# Patient Record
Sex: Female | Born: 1965 | Race: White | Hispanic: No | Smoking: Former smoker
Health system: Southern US, Community
[De-identification: ages and names within clinical notes are randomized; demographics above are authoritative.]

## PROBLEM LIST (undated history)

## (undated) DIAGNOSIS — G43909 Migraine, unspecified, not intractable, without status migrainosus: Secondary | ICD-10-CM

## (undated) DIAGNOSIS — E669 Obesity, unspecified: Secondary | ICD-10-CM

## (undated) DIAGNOSIS — R195 Other fecal abnormalities: Secondary | ICD-10-CM

## (undated) DIAGNOSIS — N951 Menopausal and female climacteric states: Secondary | ICD-10-CM

## (undated) DIAGNOSIS — B369 Superficial mycosis, unspecified: Secondary | ICD-10-CM

## (undated) DIAGNOSIS — E559 Vitamin D deficiency, unspecified: Secondary | ICD-10-CM

## (undated) HISTORY — DX: Other fecal abnormalities: R19.5

## (undated) HISTORY — DX: Migraine, unspecified, not intractable, without status migrainosus: G43.909

## (undated) HISTORY — DX: Superficial mycosis, unspecified: B36.9

## (undated) HISTORY — DX: Obesity, unspecified: E66.9

## (undated) HISTORY — DX: Menopausal and female climacteric states: N95.1

## (undated) HISTORY — DX: Vitamin D deficiency, unspecified: E55.9

---

## 1989-01-05 HISTORY — PX: DILATION AND CURETTAGE OF UTERUS: SHX78

## 1989-01-05 HISTORY — PX: FOOT SURGERY: SHX648

## 2004-01-28 ENCOUNTER — Ambulatory Visit (HOSPITAL_COMMUNITY): Admission: RE | Admit: 2004-01-28 | Discharge: 2004-01-28 | Payer: Self-pay | Admitting: Family Medicine

## 2004-03-06 ENCOUNTER — Ambulatory Visit (HOSPITAL_COMMUNITY): Admission: RE | Admit: 2004-03-06 | Discharge: 2004-03-06 | Payer: Self-pay | Admitting: Family Medicine

## 2006-10-19 ENCOUNTER — Other Ambulatory Visit: Admission: RE | Admit: 2006-10-19 | Discharge: 2006-10-19 | Payer: Self-pay | Admitting: Obstetrics and Gynecology

## 2007-01-17 ENCOUNTER — Ambulatory Visit (HOSPITAL_COMMUNITY): Admission: RE | Admit: 2007-01-17 | Discharge: 2007-01-17 | Payer: Self-pay | Admitting: Family Medicine

## 2007-01-17 ENCOUNTER — Encounter: Payer: Self-pay | Admitting: Orthopedic Surgery

## 2007-02-17 ENCOUNTER — Ambulatory Visit: Payer: Self-pay | Admitting: Orthopedic Surgery

## 2007-02-17 DIAGNOSIS — IMO0002 Reserved for concepts with insufficient information to code with codable children: Secondary | ICD-10-CM | POA: Insufficient documentation

## 2007-02-17 DIAGNOSIS — M1710 Unilateral primary osteoarthritis, unspecified knee: Secondary | ICD-10-CM

## 2007-02-17 DIAGNOSIS — M25569 Pain in unspecified knee: Secondary | ICD-10-CM | POA: Insufficient documentation

## 2007-11-03 ENCOUNTER — Other Ambulatory Visit: Admission: RE | Admit: 2007-11-03 | Discharge: 2007-11-03 | Payer: Self-pay | Admitting: Obstetrics and Gynecology

## 2008-01-03 ENCOUNTER — Ambulatory Visit (HOSPITAL_COMMUNITY): Admission: RE | Admit: 2008-01-03 | Discharge: 2008-01-03 | Payer: Self-pay | Admitting: Obstetrics and Gynecology

## 2008-01-06 HISTORY — PX: SHOULDER ARTHROSCOPY W/ ROTATOR CUFF REPAIR: SHX2400

## 2008-03-26 ENCOUNTER — Ambulatory Visit (HOSPITAL_COMMUNITY): Admission: RE | Admit: 2008-03-26 | Discharge: 2008-03-26 | Payer: Self-pay | Admitting: Family Medicine

## 2008-11-15 ENCOUNTER — Other Ambulatory Visit: Admission: RE | Admit: 2008-11-15 | Discharge: 2008-11-15 | Payer: Self-pay | Admitting: Obstetrics and Gynecology

## 2009-01-07 ENCOUNTER — Observation Stay (HOSPITAL_COMMUNITY): Admission: EM | Admit: 2009-01-07 | Discharge: 2009-01-08 | Payer: Self-pay | Admitting: Emergency Medicine

## 2009-01-07 ENCOUNTER — Ambulatory Visit: Payer: Self-pay | Admitting: Cardiology

## 2009-01-08 ENCOUNTER — Encounter (INDEPENDENT_AMBULATORY_CARE_PROVIDER_SITE_OTHER): Payer: Self-pay | Admitting: Internal Medicine

## 2009-01-11 ENCOUNTER — Ambulatory Visit (HOSPITAL_COMMUNITY): Admission: RE | Admit: 2009-01-11 | Discharge: 2009-01-11 | Payer: Self-pay | Admitting: Internal Medicine

## 2009-01-11 ENCOUNTER — Ambulatory Visit (HOSPITAL_COMMUNITY): Admission: RE | Admit: 2009-01-11 | Discharge: 2009-01-11 | Payer: Self-pay | Admitting: Obstetrics and Gynecology

## 2009-05-23 ENCOUNTER — Ambulatory Visit: Payer: Self-pay | Admitting: Orthopedic Surgery

## 2009-05-23 DIAGNOSIS — M7580 Other shoulder lesions, unspecified shoulder: Secondary | ICD-10-CM

## 2009-05-23 DIAGNOSIS — M25819 Other specified joint disorders, unspecified shoulder: Secondary | ICD-10-CM | POA: Insufficient documentation

## 2009-08-16 ENCOUNTER — Telehealth: Payer: Self-pay | Admitting: Orthopedic Surgery

## 2009-08-26 ENCOUNTER — Encounter: Payer: Self-pay | Admitting: Orthopedic Surgery

## 2009-08-26 ENCOUNTER — Telehealth: Payer: Self-pay | Admitting: Orthopedic Surgery

## 2009-12-03 ENCOUNTER — Other Ambulatory Visit
Admission: RE | Admit: 2009-12-03 | Discharge: 2009-12-03 | Payer: Self-pay | Source: Home / Self Care | Admitting: Obstetrics and Gynecology

## 2010-01-26 ENCOUNTER — Encounter: Payer: Self-pay | Admitting: Internal Medicine

## 2010-01-27 ENCOUNTER — Ambulatory Visit (HOSPITAL_COMMUNITY)
Admission: RE | Admit: 2010-01-27 | Discharge: 2010-01-27 | Payer: Self-pay | Source: Home / Self Care | Attending: Obstetrics and Gynecology | Admitting: Obstetrics and Gynecology

## 2010-02-04 NOTE — Progress Notes (Signed)
Summary: Auth signed + Office notes faxed per patient request  Phone Note Call from Patient   Caller: Patient Summary of Call: Patient signed release of info form to authorize fax of office notes to [facility] for review for appointment.  Faxed to their office Fax [phone].    *Re-faxed to  Fax [phone]; notified patient. Initial call taken by: Cammie Sickle,  August 16, 2009 2:23 PM

## 2010-02-04 NOTE — Assessment & Plan Note (Signed)
Summary: NEW PROBLEM LT SHOULDER PAIN/NEEDS XRAY/[facility]/REF [facility]/CAF   Vital Signs:  Patient profile:   45 year old female Height:      66 inches Weight:      225 pounds Pulse rate:   76 / minute Resp:     16 per minute  Vitals Entered By: Fuller Canada MD (May 23, 2009 8:45 AM)  Visit Type:  new problem Referring Provider:  Dr. Nobie Putnam Primary Provider:  Dr. Regino Schultze  CC:  left shoulder pain.  History of Present Illness: 45 year old female presents with pain over her LEFT a.c. joint for 2 months and pain over the medial border of the scapula with occasional pain radiating to her arm.  She says she was injured when a branch which was large fell onto her shoulder.  The pain occurs throughout the day worse at night occasionally relieved by ice and heat worse with overuse, pain is sharp, pain is 8/10, pain associated with tingling locking and swelling.  Treatment included Vicodin Naprosyn Voltaren gel.  Voltaren gel seem to work the best Xrays today in our office.  Meds: Ortho Novum, Vicodin, Axert, Xanax.    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 Respiratory:  Complains of short of breath; denies  wheezing, couch, tightness, pain on inspiration, and snoring . Musculoskeletal:  Complains of joint pain, swelling, and muscle pain; denies instability, stiffness, redness, and heat. HEENT:  Denies blurred or double vision, eye pain, redness, and watering; headaches.  The review of systems is negative for Constitutional, Gastrointestinal, Genitourinary, Neurologic, Endocrine, Psychiatric, Skin, Immunology, and Hemoatologic.  Physical Exam  Additional Exam:  General: The patient is well-developed and well-nourished grooming and hygiene is normal.  Cardiovascular exam was normal  Her skin was intact no bruising rashes or swelling  She had normal sensation and reflexes in both upper extremities  Should no lymphadenopathy in the cervical or axillary areas  The LEFT shoulder was tender over the a.c. joint.  Range of motion was full.  Strength was normal.  Stability was normal.  She did have a positive impingement sign with forward elevation.  She has a negative Hawkins sign and no pain with adduction.   Impression & Recommendations:  Problem # 1:  IMPINGEMENT SYNDROME (ICD-726.2) Assessment New  x-rays of the LEFT shoulder AP and scapular Y. with 10 caudal tilt  Type I Acromion normal GH joint   Normal shoulder  We went ahead and proceeded with a subacromial injection of the LEFT shoulder Verbal consent obtained/The shoulder was injected with depomedrol 40mg /cc and sensorcaine .25% . There were no complications  Orders: Est. Patient Level IV (16109) Depo- Medrol 40mg  (J1030) Joint Aspirate / Injection, Large (20610) Shoulder x-ray,  minimum 2 views (60454)  Patient Instructions: 1)  You have received an injection of cortisone today. You  may experience increased pain at the injection site. Apply ice pack to the area for 20 minutes every 2 hours and take 2 xtra strength tylenol every 8 hours. This increased pain will usually resolve in 24 hours. The injection will take effect in  3-10 days.  2)  Home exercise program x 6 weeks  3)  Please schedule a follow-up appointment as needed.

## 2010-02-04 NOTE — Letter (Signed)
Summary: office notes faxed [facility]   office notes faxed [facility]   Imported By: Cammie Sickle 08/27/2009 11:56:47  _____________________________________________________________________  External Attachment:    Type:   Image     Comment:   External Document

## 2010-02-04 NOTE — Progress Notes (Signed)
Summary: patient states [facility] did not receive our fax  Phone Note Call from Patient   Caller: Patient Summary of Call: Patient ret'd from trip and called [facility] and was told that the records were never rec'd as per pt request. As per last note, they were faxed to 2 different ph #'s.  Re-faxed to [phone] atten: Selena Batten. Initial call taken by: Cammie Sickle,  August 26, 2009 1:44 PM  Follow-up for Phone Call        Fax rec'd and appt is scheduled at [facility] per Selena Batten per patient request. Follow-up by: Cammie Sickle,  August 28, 2009 12:15 PM

## 2010-02-04 NOTE — Letter (Signed)
Summary: History form  History form   Imported By: Jacklynn Ganong 05/31/2009 08:48:24  _____________________________________________________________________  External Attachment:    Type:   Image     Comment:   External Document

## 2010-02-06 ENCOUNTER — Other Ambulatory Visit: Payer: Self-pay | Admitting: Obstetrics and Gynecology

## 2010-02-06 DIAGNOSIS — R928 Other abnormal and inconclusive findings on diagnostic imaging of breast: Secondary | ICD-10-CM

## 2010-02-12 ENCOUNTER — Ambulatory Visit
Admission: RE | Admit: 2010-02-12 | Discharge: 2010-02-12 | Disposition: A | Discharge status: Home / Self Care | Payer: 59 | Source: Ambulatory Visit | Attending: Obstetrics and Gynecology | Admitting: Obstetrics and Gynecology

## 2010-02-12 DIAGNOSIS — R928 Other abnormal and inconclusive findings on diagnostic imaging of breast: Secondary | ICD-10-CM

## 2010-03-23 LAB — HEPATIC FUNCTION PANEL
ALT: 17 U/L (ref 0–35)
Albumin: 3.7 g/dL (ref 3.5–5.2)
Alkaline Phosphatase: 46 U/L (ref 39–117)
Indirect Bilirubin: 0.6 mg/dL (ref 0.3–0.9)
Total Bilirubin: 0.7 mg/dL (ref 0.3–1.2)

## 2010-03-23 LAB — CARDIAC PANEL(CRET KIN+CKTOT+MB+TROPI)
CK, MB: 1.2 ng/mL (ref 0.3–4.0)
Troponin I: <0.01 ng/mL (ref 0.00–0.06)

## 2010-03-23 LAB — CBC
MCHC: 34.4 g/dL (ref 30.0–36.0)
MCV: 90.1 fL (ref 78.0–100.0)
RDW: 13.2 % (ref 11.5–15.5)

## 2010-03-23 LAB — BASIC METABOLIC PANEL
BUN: 7 mg/dL (ref 6–23)
BUN: 9 mg/dL (ref 6–23)
CO2: 26 mEq/L (ref 19–32)
Calcium: 9 mg/dL (ref 8.4–10.5)
Chloride: 103 mEq/L (ref 96–112)
Creatinine, Ser: 0.65 mg/dL (ref 0.4–1.2)
Creatinine, Ser: 0.68 mg/dL (ref 0.4–1.2)
GFR calc Af Amer: >60 mL/min (ref >60)
Glucose, Bld: 102 mg/dL — ABNORMAL HIGH (ref 70–99)
Glucose, Bld: 115 mg/dL — ABNORMAL HIGH (ref 70–99)
Potassium: 3.7 mEq/L (ref 3.5–5.1)

## 2010-03-23 LAB — DIFFERENTIAL
Basophils Absolute: 0.1 10*3/uL (ref 0.0–0.1)
Basophils Relative: 1 % (ref 0–1)
Eosinophils Absolute: 0 10*3/uL (ref 0.0–0.7)
Neutro Abs: 6.3 10*3/uL (ref 1.7–7.7)
Neutrophils Relative %: 61 % (ref 43–77)

## 2010-03-23 LAB — POCT CARDIAC MARKERS
CKMB, poc: 1.1 ng/mL (ref 1.0–8.0)
Myoglobin, poc: 68 ng/mL (ref 12–200)

## 2010-03-23 LAB — LIPID PANEL
HDL: 50 mg/dL (ref >39)
LDL Cholesterol: 101 mg/dL — ABNORMAL HIGH (ref 0–99)
Total CHOL/HDL Ratio: 3.5 RATIO
Triglycerides: 112 mg/dL (ref <150)
VLDL: 22 mg/dL (ref 0–40)

## 2010-03-23 LAB — LIPASE, BLOOD: Lipase: 20 U/L (ref 11–59)

## 2010-04-09 ENCOUNTER — Other Ambulatory Visit: Payer: Self-pay | Admitting: Obstetrics and Gynecology

## 2010-04-09 DIAGNOSIS — N6310 Unspecified lump in the right breast, unspecified quadrant: Secondary | ICD-10-CM

## 2010-04-10 ENCOUNTER — Ambulatory Visit
Admission: RE | Admit: 2010-04-10 | Discharge: 2010-04-10 | Disposition: A | Discharge status: Home / Self Care | Payer: 59 | Source: Ambulatory Visit | Attending: Obstetrics and Gynecology | Admitting: Obstetrics and Gynecology

## 2010-04-10 ENCOUNTER — Other Ambulatory Visit: Payer: Self-pay | Admitting: Obstetrics and Gynecology

## 2010-04-10 DIAGNOSIS — N6310 Unspecified lump in the right breast, unspecified quadrant: Secondary | ICD-10-CM

## 2010-07-10 ENCOUNTER — Other Ambulatory Visit: Payer: Self-pay | Admitting: Obstetrics and Gynecology

## 2010-07-10 DIAGNOSIS — R922 Inconclusive mammogram: Secondary | ICD-10-CM

## 2010-08-11 ENCOUNTER — Ambulatory Visit
Admission: RE | Admit: 2010-08-11 | Discharge: 2010-08-11 | Disposition: A | Discharge status: Home / Self Care | Payer: 59 | Source: Ambulatory Visit | Attending: Obstetrics and Gynecology | Admitting: Obstetrics and Gynecology

## 2010-08-11 DIAGNOSIS — R922 Inconclusive mammogram: Secondary | ICD-10-CM

## 2010-12-10 ENCOUNTER — Other Ambulatory Visit (HOSPITAL_COMMUNITY)
Admission: RE | Admit: 2010-12-10 | Discharge: 2010-12-10 | Disposition: A | Discharge status: Home / Self Care | Payer: 59 | Source: Ambulatory Visit | Attending: Obstetrics and Gynecology | Admitting: Obstetrics and Gynecology

## 2010-12-10 ENCOUNTER — Other Ambulatory Visit: Payer: Self-pay | Admitting: Adult Health

## 2010-12-10 ENCOUNTER — Other Ambulatory Visit: Payer: Self-pay | Admitting: Obstetrics and Gynecology

## 2010-12-10 DIAGNOSIS — Z01419 Encounter for gynecological examination (general) (routine) without abnormal findings: Secondary | ICD-10-CM | POA: Insufficient documentation

## 2010-12-10 DIAGNOSIS — Z1231 Encounter for screening mammogram for malignant neoplasm of breast: Secondary | ICD-10-CM

## 2011-01-30 ENCOUNTER — Ambulatory Visit: Payer: 59

## 2011-02-10 ENCOUNTER — Ambulatory Visit
Admission: RE | Admit: 2011-02-10 | Discharge: 2011-02-10 | Disposition: A | Discharge status: Home / Self Care | Payer: 59 | Source: Ambulatory Visit | Attending: Obstetrics and Gynecology | Admitting: Obstetrics and Gynecology

## 2011-02-10 DIAGNOSIS — Z1231 Encounter for screening mammogram for malignant neoplasm of breast: Secondary | ICD-10-CM

## 2011-09-14 ENCOUNTER — Ambulatory Visit (HOSPITAL_COMMUNITY)
Admission: RE | Admit: 2011-09-14 | Discharge: 2011-09-14 | Disposition: A | Discharge status: Home / Self Care | Payer: 59 | Source: Ambulatory Visit | Attending: Internal Medicine | Admitting: Internal Medicine

## 2011-09-14 ENCOUNTER — Other Ambulatory Visit (HOSPITAL_COMMUNITY): Payer: Self-pay | Admitting: Internal Medicine

## 2011-09-14 DIAGNOSIS — M25549 Pain in joints of unspecified hand: Secondary | ICD-10-CM

## 2012-01-21 ENCOUNTER — Other Ambulatory Visit: Payer: Self-pay | Admitting: Obstetrics and Gynecology

## 2012-01-21 DIAGNOSIS — Z1231 Encounter for screening mammogram for malignant neoplasm of breast: Secondary | ICD-10-CM

## 2012-01-28 ENCOUNTER — Other Ambulatory Visit: Payer: Self-pay | Admitting: Adult Health

## 2012-01-28 ENCOUNTER — Other Ambulatory Visit (HOSPITAL_COMMUNITY)
Admission: RE | Admit: 2012-01-28 | Discharge: 2012-01-28 | Disposition: A | Discharge status: Home / Self Care | Payer: 59 | Source: Ambulatory Visit | Attending: Obstetrics and Gynecology | Admitting: Obstetrics and Gynecology

## 2012-01-28 DIAGNOSIS — Z01419 Encounter for gynecological examination (general) (routine) without abnormal findings: Secondary | ICD-10-CM | POA: Insufficient documentation

## 2012-01-28 DIAGNOSIS — Z1151 Encounter for screening for human papillomavirus (HPV): Secondary | ICD-10-CM | POA: Insufficient documentation

## 2012-02-12 ENCOUNTER — Ambulatory Visit
Admission: RE | Admit: 2012-02-12 | Discharge: 2012-02-12 | Disposition: A | Discharge status: Home / Self Care | Payer: 59 | Source: Ambulatory Visit | Attending: Obstetrics and Gynecology | Admitting: Obstetrics and Gynecology

## 2012-02-12 ENCOUNTER — Ambulatory Visit: Payer: 59

## 2012-02-12 DIAGNOSIS — Z1231 Encounter for screening mammogram for malignant neoplasm of breast: Secondary | ICD-10-CM

## 2012-12-20 ENCOUNTER — Ambulatory Visit: Payer: 59 | Admitting: Cardiovascular Disease

## 2013-01-09 ENCOUNTER — Ambulatory Visit: Payer: 59 | Admitting: Cardiovascular Disease

## 2013-01-31 ENCOUNTER — Ambulatory Visit: Payer: 59 | Admitting: Cardiovascular Disease

## 2013-02-06 ENCOUNTER — Encounter: Payer: Self-pay | Admitting: Adult Health

## 2013-02-06 ENCOUNTER — Ambulatory Visit (INDEPENDENT_AMBULATORY_CARE_PROVIDER_SITE_OTHER): Payer: 59 | Admitting: Adult Health

## 2013-02-06 VITALS — BP 138/80 | HR 76 | Ht 65.0 in | Wt 223.0 lb

## 2013-02-06 DIAGNOSIS — Z01419 Encounter for gynecological examination (general) (routine) without abnormal findings: Secondary | ICD-10-CM

## 2013-02-06 DIAGNOSIS — Z1212 Encounter for screening for malignant neoplasm of rectum: Secondary | ICD-10-CM

## 2013-02-06 LAB — CBC
HCT: 40.6 % (ref 36.0–46.0)
Hemoglobin: 13.6 g/dL (ref 12.0–15.0)
MCH: 30.8 pg (ref 26.0–34.0)
MCHC: 33.5 g/dL (ref 30.0–36.0)
MCV: 91.9 fL (ref 78.0–100.0)
PLATELETS: 304 10*3/uL (ref 150–400)
RBC: 4.42 MIL/uL (ref 3.87–5.11)
RDW: 13 % (ref 11.5–15.5)
WBC: 7.5 10*3/uL (ref 4.0–10.5)

## 2013-02-06 LAB — HEMOCCULT GUIAC POC 1CARD (OFFICE): Fecal Occult Blood, POC: NEGATIVE

## 2013-02-06 NOTE — Progress Notes (Signed)
Patient ID: Mckenzie Nguyen, female   DOB: 1965-11-24, 48 y.o.   MRN: [id number] History of Present Illness: Mckenzie Nguyen is a 48 year old white female, in for a physical , she had a normal pap with negative HPV 01/28/12.   Current Medications, Allergies, Past Medical History, Past Surgical History, Family History and Social History were reviewed in [facility] electronic medical record.     Review of Systems: Patient denies any headaches, blurred vision, shortness of breath, chest pain, abdominal pain, problems with bowel movements, urination, or intercourse. No joint swelling or mood swings.Is working 2 jobs, going to school and building a house.    Physical Exam:BP 138/80  Pulse 76  Ht 5\' 5"  (1.651 m)  Wt 223 lb (101.152 kg)  BMI 37.11 kg/m2  LMP 01/18/2013 General:  Well developed, well nourished, no acute distress Skin:  Warm and dry Neck:  Midline trachea, normal thyroid Lungs; Clear to auscultation bilaterally Breast:  No dominant palpable mass, retraction, or nipple discharge Cardiovascular: Regular rate and rhythm Abdomen:  Soft, non tender, no hepatosplenomegaly Pelvic:  External genitalia is normal in appearance.  The vagina is normal in appearance. The cervix is smooth.  Uterus is felt to be normal size, shape, and contour.  No adnexal masses or tenderness noted. Rectal: Good sphincter tone, no polyps, or hemorrhoids felt.  Hemoccult negative. Extremities:  No swelling noted Psych:  No mood changes, alert and cooperative, seems happy   Impression: Yearly gyn exam no pap    Plan: Physical in 1 year, pap 2017 Check CBC,CMP,TSH and lipids Mammogram yearly Call prn

## 2013-02-06 NOTE — Patient Instructions (Signed)
Physical in 1 year Mammogram yearly Labs today follow up in 48 hours

## 2013-02-07 ENCOUNTER — Telehealth: Payer: Self-pay | Admitting: Adult Health

## 2013-02-07 LAB — LIPID PANEL
Cholesterol: 185 mg/dL (ref 0–200)
HDL: 61 mg/dL (ref >39)
LDL CALC: 100 mg/dL — AB (ref 0–99)
TRIGLYCERIDES: 122 mg/dL (ref <150)
Total CHOL/HDL Ratio: 3 Ratio
VLDL: 24 mg/dL (ref 0–40)

## 2013-02-07 LAB — COMPREHENSIVE METABOLIC PANEL
ALBUMIN: 4.3 g/dL (ref 3.5–5.2)
ALK PHOS: 63 U/L (ref 39–117)
ALT: 13 U/L (ref 0–35)
AST: 17 U/L (ref 0–37)
BUN: 11 mg/dL (ref 6–23)
CO2: 26 mEq/L (ref 19–32)
Calcium: 9.6 mg/dL (ref 8.4–10.5)
Chloride: 102 mEq/L (ref 96–112)
Creat: 0.55 mg/dL (ref 0.50–1.10)
Glucose, Bld: 97 mg/dL (ref 70–99)
POTASSIUM: 4.8 meq/L (ref 3.5–5.3)
SODIUM: 141 meq/L (ref 135–145)
TOTAL PROTEIN: 6.9 g/dL (ref 6.0–8.3)
Total Bilirubin: 0.7 mg/dL (ref 0.2–1.2)

## 2013-02-07 LAB — TSH: TSH: 2.771 u[IU]/mL (ref 0.350–4.500)

## 2013-02-07 NOTE — Telephone Encounter (Signed)
Pt aware of labs, will mail copy to her

## 2013-03-07 ENCOUNTER — Encounter: Payer: Self-pay | Admitting: Cardiovascular Disease

## 2013-03-07 ENCOUNTER — Ambulatory Visit (INDEPENDENT_AMBULATORY_CARE_PROVIDER_SITE_OTHER): Payer: 59 | Admitting: Cardiovascular Disease

## 2013-03-07 VITALS — BP 138/92 | HR 85 | Resp 16 | Ht 65.0 in | Wt 221.9 lb

## 2013-03-07 DIAGNOSIS — E669 Obesity, unspecified: Secondary | ICD-10-CM

## 2013-03-07 DIAGNOSIS — R002 Palpitations: Secondary | ICD-10-CM

## 2013-03-07 DIAGNOSIS — I10 Essential (primary) hypertension: Secondary | ICD-10-CM

## 2013-03-07 NOTE — Patient Instructions (Signed)
Congratulations on the weight loss. Keep up the good work - try to lose a similar amount this year.  Your physician recommends that you schedule a follow-up appointment as needed.

## 2013-03-18 ENCOUNTER — Encounter: Payer: Self-pay | Admitting: Cardiovascular Disease

## 2013-03-18 DIAGNOSIS — I10 Essential (primary) hypertension: Secondary | ICD-10-CM | POA: Insufficient documentation

## 2013-03-18 DIAGNOSIS — E669 Obesity, unspecified: Secondary | ICD-10-CM | POA: Insufficient documentation

## 2013-03-18 DIAGNOSIS — R002 Palpitations: Secondary | ICD-10-CM | POA: Insufficient documentation

## 2013-03-18 NOTE — Progress Notes (Signed)
Patient ID: Mckenzie Nguyen, female   DOB: 1965-07-16, 48 y.o.   MRN: [id number]      Reason for office visit Hypertension, obesity, palpitations  Mckenzie Nguyen has made substantial progress with weight loss over the last year. She is 19 pounds lighter than she was when we last saw her in the office. She stopped taking oral contraceptives and this has led to resolution of her migraines and also reduction in her palpitations. She still gets occasional palpitations as before her menstrual period but these are not all troublesome at this point. Previous Holter monitor evaluation showed that these are associated with PVCs. She has no known structural heart disease.  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 She had a normal stress test a couple of years ago and did have a hypertensive response to exercise but normal blood pressure at rest.   No Known Allergies  Current Outpatient Prescriptions  Medication Sig Dispense Refill  . aspirin 81 MG tablet Take 81 mg by mouth daily.      . Cholecalciferol (VITAMIN D-3 PO) Take 1 tablet by mouth daily.      . Magnesium-Potassium 250-100 MG TABS Take 1 capsule by mouth daily.      . Multiple Vitamin (MULTIVITAMIN) capsule Take 1 capsule by mouth daily.       No current facility-administered medications for this visit.    Past Medical History  Diagnosis Date  . Obesity     Past Surgical History  Procedure Laterality Date  . Shoulder arthroscopy w/ rotator cuff repair Left 2010  . Foot surgery Left 1991  . Dilation and curettage of uterus  1991    Family History  Problem Relation Age of Onset  . Hypertension Mother   . Asthma Mother   . Cancer Father     lung  . Diabetes Father   . Hypertension Father     History   Social History  . Marital Status: Divorced    Spouse Name:  N/A    Number of Children: N/A  . Years of Education: N/A   Occupational History  . Not on file.   Social History Main Topics  . Smoking status: Former Smoker    Quit date: 02/07/2003  . Smokeless tobacco: Never Used  . Alcohol Use: Yes     Comment: occ.  . Drug Use: No  . Sexual Activity: Yes    Birth Control/ Protection: None, Condom   Other Topics Concern  . Not on file   Social History Narrative  . No narrative on file    Review of systems: The patient specifically denies any chest pain at rest or with exertion, dyspnea at rest or with exertion, orthopnea, paroxysmal nocturnal dyspnea, syncope, ocal neurological deficits, intermittent claudication, lower extremity edema, unexplained weight gain, cough, hemoptysis or wheezing.  The patient also denies abdominal pain, nausea, vomiting, dysphagia, diarrhea, constipation, polyuria, polydipsia, dysuria, hematuria, frequency, urgency, abnormal bleeding or bruising, fever, chills, unexpected weight changes, mood swings, change in skin or hair texture, change in voice quality, auditory or visual problems, allergic reactions or rashes, new musculoskeletal complaints other than usual "aches and pains".   PHYSICAL EXAM BP 138/92  Pulse 85  Resp 16  Ht _0  (1.651 m)  Wt 100.653 kg (221 lb 14.4 oz)  BMI 36.93 kg/m2  LMP 01/18/2013  General: Alert, oriented x3, no distress Head: no evidence of trauma, PERRL, EOMI, no exophtalmos or lid lag, no myxedema, no xanthelasma; normal ears, nose and oropharynx Neck: normal jugular venous pulsations and no hepatojugular reflux; brisk carotid pulses without delay and no carotid bruits Chest: clear to auscultation, no signs of consolidation by percussion or palpation, normal fremitus, symmetrical and full respiratory excursions Cardiovascular: normal position and quality of the apical impulse, regular rhythm, normal first and second heart sounds, no murmurs, rubs or gallops Abdomen: no  tenderness or distention, no masses by palpation, no abnormal pulsatility or arterial bruits, normal bowel sounds, no hepatosplenomegaly Extremities: no clubbing, cyanosis or edema; 2+ radial, ulnar and brachial pulses bilaterally; 2+ right femoral, posterior tibial and dorsalis pedis pulses; 2+ left femoral, posterior tibial and dorsalis pedis pulses; no subclavian or femoral bruits Neurological: grossly nonfocal   EKG: Normal sinus rhythm questionable left atrial enlargement otherwise normal  Lipid Panel     Component Value Date/Time   CHOL 185 02/06/2013 0948   TRIG 122 02/06/2013 0948   HDL 61 02/06/2013 0948   CHOLHDL 3.0 02/06/2013 0948   VLDL 24 02/06/2013 0948   LDLCALC 100* 02/06/2013 0948    BMET    Component Value Date/Time   NA 141 02/06/2013 0948   K 4.8 02/06/2013 0948   CL 102 02/06/2013 0948   CO2 26 02/06/2013 0948   GLUCOSE 97 02/06/2013 0948   BUN 11 02/06/2013 0948   CREATININE 0.55 02/06/2013 0948   CREATININE 0.65 01/07/2009 2250   CALCIUM 9.6 02/06/2013 0948   GFRNONAA >60 01/07/2009 2250   GFRAA  Value: >60        The eGFR has been calculated using the MDRD equation. This calculation has not been validated in all clinical situations. eGFR's persistently <60 mL/min signify possible Chronic Kidney Disease. 01/07/2009 [place]  [name] is doing better. Her symptoms have essentially resolved. She has lost substantial weight and plans to keep up the diet and exercise program which has helped her achieve this. She has an excellent lipid profile. Her palpitations are not frequent enough or severe enough to warrant any medical therapy.  Patient Instructions  Congratulations on the weight loss. Keep up the good work - try to lose a similar amount this year.  Your physician recommends that you schedule a follow-up appointment as needed.      Orders Placed This Encounter  Procedures  . EKG 12-Lead  .Holli Humbles, MD, [place] [phone] office [phone] pager

## 2013-04-11 ENCOUNTER — Other Ambulatory Visit: Payer: Self-pay

## 2013-04-11 DIAGNOSIS — Z1231 Encounter for screening mammogram for malignant neoplasm of breast: Secondary | ICD-10-CM

## 2013-04-20 ENCOUNTER — Ambulatory Visit
Admission: RE | Admit: 2013-04-20 | Discharge: 2013-04-20 | Disposition: A | Discharge status: Home / Self Care | Payer: 59 | Source: Ambulatory Visit

## 2013-04-20 DIAGNOSIS — Z1231 Encounter for screening mammogram for malignant neoplasm of breast: Secondary | ICD-10-CM

## 2013-11-06 ENCOUNTER — Encounter: Payer: Self-pay | Admitting: Cardiovascular Disease

## 2014-04-10 ENCOUNTER — Other Ambulatory Visit: Payer: Self-pay

## 2014-04-10 DIAGNOSIS — Z1231 Encounter for screening mammogram for malignant neoplasm of breast: Secondary | ICD-10-CM

## 2014-04-26 ENCOUNTER — Ambulatory Visit
Admission: RE | Admit: 2014-04-26 | Discharge: 2014-04-26 | Disposition: A | Discharge status: Home / Self Care | Payer: Self-pay | Source: Ambulatory Visit

## 2014-04-26 DIAGNOSIS — Z1231 Encounter for screening mammogram for malignant neoplasm of breast: Secondary | ICD-10-CM

## 2014-05-03 ENCOUNTER — Encounter: Payer: Self-pay | Admitting: Adult Health

## 2014-05-03 ENCOUNTER — Ambulatory Visit (INDEPENDENT_AMBULATORY_CARE_PROVIDER_SITE_OTHER): Payer: 59 | Admitting: Adult Health

## 2014-05-03 VITALS — BP 130/78 | HR 80 | Ht 65.0 in | Wt 226.0 lb

## 2014-05-03 DIAGNOSIS — Z1212 Encounter for screening for malignant neoplasm of rectum: Secondary | ICD-10-CM

## 2014-05-03 DIAGNOSIS — Z01419 Encounter for gynecological examination (general) (routine) without abnormal findings: Secondary | ICD-10-CM

## 2014-05-03 DIAGNOSIS — N951 Menopausal and female climacteric states: Secondary | ICD-10-CM

## 2014-05-03 DIAGNOSIS — B369 Superficial mycosis, unspecified: Secondary | ICD-10-CM

## 2014-05-03 HISTORY — DX: Superficial mycosis, unspecified: B36.9

## 2014-05-03 HISTORY — DX: Menopausal and female climacteric states: N95.1

## 2014-05-03 LAB — HEMOCCULT GUIAC POC 1CARD (OFFICE): FECAL OCCULT BLD: NEGATIVE

## 2014-05-03 MED ORDER — NYSTATIN-TRIAMCINOLONE 100000-0.1 UNIT/GM-% EX CREA: 1.0000 "application " | TOPICAL_CREAM | Freq: Two times a day (BID) | CUTANEOUS | Status: DC

## 2014-05-03 NOTE — Progress Notes (Signed)
Patient ID: Mckenzie Nguyen, female   DOB: March 30, 1965, 49 y.o.   MRN: [id number] History of Present Illness: Mckenzie Nguyen is a 49 year old white female, in relationship, in for well woman gyn exam.She had a normal pap with negative HPV 01/28/12.   Current Medications, Allergies, Past Medical History, Past Surgical History, Family History and Social History were reviewed in [facility] electronic medical record.     Review of Systems: Patient denies any headaches, hearing loss, fatigue, blurred vision, shortness of breath, chest pain, abdominal pain, problems with bowel movements, urination, or intercourse. 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    Physical Exam:BP 130/78 mmHg  Pulse 80  Ht 5\' 5"  (1.651 m)  Wt 226 lb (102.513 kg)  BMI 37.61 kg/m2  LMP 04/11/2014 General:  Well developed, well nourished, no acute distress Skin:  Warm and dry Neck:  Midline trachea, normal thyroid, good ROM, no lymphadenopathy Lungs; Clear to auscultation bilaterally Breast:  No dominant palpable mass, retraction, or nipple discharge Cardiovascular: Regular rate and rhythm Abdomen:  Soft, non tender, no hepatosplenomegaly, has skin fungus under panniculus  Pelvic:  External genitalia is normal in appearance, no lesions.  The vagina is normal in appearance. Urethra has no lesions or masses. The cervix is smooth.  Uterus is felt to be normal size, shape, and contour.  No adnexal masses or tenderness noted.Bladder is non tender, no masses felt. Rectal: Good sphincter tone, no polyps, or hemorrhoids felt.  Hemoccult negative. Extremities/musculoskeletal:  No swelling or varicosities noted, no clubbing or cyanosis Psych:  No mood changes, alert and cooperative,seems happy   Impression: Well woman gyn exam no pap Peri  menopause Skin fungus     Plan: Pap and physical in 1 year Mammogram yearly Colonoscopy at 52  Check CBC,CMP,TSH and lipids, will talk when back Rx mytrex cream use bid prn with 1 refill

## 2014-05-03 NOTE — Patient Instructions (Signed)
Pap and physical in 1 year Mammogram yearly  Colonoscopy at 52 

## 2014-05-04 LAB — COMPREHENSIVE METABOLIC PANEL
A/G RATIO: 2 (ref 1.1–2.5)
ALBUMIN: 4.5 g/dL (ref 3.5–5.5)
ALK PHOS: 61 IU/L (ref 39–117)
ALT: 15 IU/L (ref 0–32)
AST: 17 IU/L (ref 0–40)
BILIRUBIN TOTAL: 0.8 mg/dL (ref 0.0–1.2)
BUN/Creatinine Ratio: 22 (ref 9–23)
BUN: 14 mg/dL (ref 6–24)
CO2: 22 mmol/L (ref 18–29)
CREATININE: 0.64 mg/dL (ref 0.57–1.00)
Calcium: 9.4 mg/dL (ref 8.7–10.2)
Chloride: 100 mmol/L (ref 97–108)
GFR calc non Af Amer: 107 mL/min/{1.73_m2} (ref >59)
GFR, EST AFRICAN AMERICAN: 124 mL/min/{1.73_m2} (ref >59)
GLOBULIN, TOTAL: 2.2 g/dL (ref 1.5–4.5)
Glucose: 92 mg/dL (ref 65–99)
POTASSIUM: 4.8 mmol/L (ref 3.5–5.2)
Sodium: 138 mmol/L (ref 134–144)
Total Protein: 6.7 g/dL (ref 6.0–8.5)

## 2014-05-04 LAB — LIPID PANEL
CHOL/HDL RATIO: 3 ratio (ref 0.0–4.4)
CHOLESTEROL TOTAL: 196 mg/dL (ref 100–199)
HDL: 65 mg/dL (ref >39)
LDL Calculated: 102 mg/dL — ABNORMAL HIGH (ref 0–99)
TRIGLYCERIDES: 144 mg/dL (ref 0–149)
VLDL Cholesterol Cal: 29 mg/dL (ref 5–40)

## 2014-05-04 LAB — CBC
Hematocrit: 39.9 % (ref 34.0–46.6)
Hemoglobin: 13.7 g/dL (ref 11.1–15.9)
MCH: 31.4 pg (ref 26.6–33.0)
MCHC: 34.3 g/dL (ref 31.5–35.7)
MCV: 91 fL (ref 79–97)
Platelets: 263 10*3/uL (ref 150–379)
RBC: 4.37 x10E6/uL (ref 3.77–5.28)
RDW: 13.1 % (ref 12.3–15.4)
WBC: 7.1 10*3/uL (ref 3.4–10.8)

## 2014-05-04 LAB — TSH: TSH: 2.7 u[IU]/mL (ref 0.450–4.500)

## 2014-05-07 ENCOUNTER — Telehealth: Payer: Self-pay | Admitting: Adult Health

## 2014-05-07 NOTE — Telephone Encounter (Signed)
Pt aware labs look good, will mail her a copy

## 2014-08-06 ENCOUNTER — Encounter: Payer: Self-pay | Admitting: Cardiovascular Disease

## 2015-06-12 ENCOUNTER — Encounter: Payer: Self-pay | Admitting: Adult Health

## 2015-06-12 ENCOUNTER — Other Ambulatory Visit (HOSPITAL_COMMUNITY)
Admission: RE | Admit: 2015-06-12 | Discharge: 2015-06-12 | Disposition: A | Discharge status: Home / Self Care | Payer: 59 | Source: Ambulatory Visit | Attending: Adult Health | Admitting: Adult Health

## 2015-06-12 ENCOUNTER — Ambulatory Visit (INDEPENDENT_AMBULATORY_CARE_PROVIDER_SITE_OTHER): Payer: 59 | Admitting: Adult Health

## 2015-06-12 VITALS — BP 138/70 | HR 74 | Ht 64.5 in | Wt 210.5 lb

## 2015-06-12 DIAGNOSIS — Z01419 Encounter for gynecological examination (general) (routine) without abnormal findings: Secondary | ICD-10-CM

## 2015-06-12 DIAGNOSIS — N951 Menopausal and female climacteric states: Secondary | ICD-10-CM

## 2015-06-12 DIAGNOSIS — Z1212 Encounter for screening for malignant neoplasm of rectum: Secondary | ICD-10-CM | POA: Diagnosis not present

## 2015-06-12 DIAGNOSIS — R195 Other fecal abnormalities: Secondary | ICD-10-CM

## 2015-06-12 DIAGNOSIS — Z1151 Encounter for screening for human papillomavirus (HPV): Secondary | ICD-10-CM | POA: Diagnosis not present

## 2015-06-12 HISTORY — DX: Other fecal abnormalities: R19.5

## 2015-06-12 LAB — HEMOCCULT GUIAC POC 1CARD (OFFICE): FECAL OCCULT BLD: POSITIVE — AB

## 2015-06-12 NOTE — Patient Instructions (Signed)
Physical in 1 year, pap in 3 if normal Mammogram yearly Do 3 hemoccult cards

## 2015-06-12 NOTE — Progress Notes (Signed)
Patient ID: Mckenzie Nguyen, female   DOB: 11-07-1965, 50 y.o.   MRN: [id number] History of Present Illness: Mckenzie Nguyen is a 50 year old white female, in for a well woman gyn exam and pap.No complaints today.Has new puppy and had tick bite recently.   Current Medications, Allergies, Past Medical History, Past Surgical History, Family History and Social History were reviewed in [facility] electronic medical record.     Review of Systems: Patient denies any headaches, hearing loss, fatigue, blurred vision, shortness of breath, chest pain, abdominal pain, problems with bowel movements, urination, or intercourse. No joint pain or mood swings.She did say she drank beet juice yesterday and BM red this morning.Still having periods and uses condoms.    Physical Exam:BP 138/70 mmHg  Pulse 74  Ht 5' 4.5" (1.638 m)  Wt 210 lb 8 oz (95.482 kg)  BMI 35.59 kg/m2  LMP 05/29/2015 (Approximate) General:  Well developed, well nourished, no acute distress Skin:  Warm and dry Neck:  Midline trachea, normal thyroid, good ROM, no lymphadenopathy Lungs; Clear to auscultation bilaterally Breast:  No dominant palpable mass, retraction, or nipple discharge Cardiovascular: Regular rate and rhythm Abdomen:  Soft, non tender, no hepatosplenomegaly Pelvic:  External genitalia is normal in appearance, no lesions.  The vagina is normal in appearance. Urethra has no lesions or masses. The cervix is smooth, pap with HPV performed.  Uterus is felt to be normal size, shape, and contour.  No adnexal masses or tenderness noted.Bladder is non tender, no masses felt. Rectal: Good sphincter tone, no polyps, or hemorrhoids felt.  Hemoccult positive. Extremities/musculoskeletal:  No swelling or varicosities noted, no clubbing or cyanosis Psych:  No mood changes, alert and cooperative,seems happy   Impression: Well woman gyn exam and pap +fecal occult blood test Peri menopause     Plan: Check CBC,CMP,TSH and  lipids,A1c and vitamin D 3 hemoccult cards sent home to do,if any + will get colonoscopy now Physical in 1 year, pap in 3 if normal Mammogram yearly

## 2015-06-13 ENCOUNTER — Telehealth: Payer: Self-pay | Admitting: Adult Health

## 2015-06-13 ENCOUNTER — Encounter: Payer: Self-pay | Admitting: Adult Health

## 2015-06-13 DIAGNOSIS — E559 Vitamin D deficiency, unspecified: Secondary | ICD-10-CM

## 2015-06-13 HISTORY — DX: Vitamin D deficiency, unspecified: E55.9

## 2015-06-13 LAB — CBC
Hematocrit: 42.9 % (ref 34.0–46.6)
Hemoglobin: 13.8 g/dL (ref 11.1–15.9)
MCH: 30.2 pg (ref 26.6–33.0)
MCHC: 32.2 g/dL (ref 31.5–35.7)
MCV: 94 fL (ref 79–97)
PLATELETS: 280 10*3/uL (ref 150–379)
RBC: 4.57 x10E6/uL (ref 3.77–5.28)
RDW: 12.8 % (ref 12.3–15.4)
WBC: 6 10*3/uL (ref 3.4–10.8)

## 2015-06-13 LAB — LIPID PANEL
CHOL/HDL RATIO: 3.4 ratio (ref 0.0–4.4)
Cholesterol, Total: 174 mg/dL (ref 100–199)
HDL: 51 mg/dL (ref >39)
LDL CALC: 96 mg/dL (ref 0–99)
Triglycerides: 133 mg/dL (ref 0–149)
VLDL CHOLESTEROL CAL: 27 mg/dL (ref 5–40)

## 2015-06-13 LAB — TSH: TSH: 2.43 u[IU]/mL (ref 0.450–4.500)

## 2015-06-13 LAB — COMPREHENSIVE METABOLIC PANEL
A/G RATIO: 1.8 (ref 1.2–2.2)
ALT: 22 IU/L (ref 0–32)
AST: 20 IU/L (ref 0–40)
Albumin: 4.3 g/dL (ref 3.5–5.5)
Alkaline Phosphatase: 60 IU/L (ref 39–117)
BILIRUBIN TOTAL: 0.5 mg/dL (ref 0.0–1.2)
BUN / CREAT RATIO: 21 (ref 9–23)
BUN: 14 mg/dL (ref 6–24)
CALCIUM: 9 mg/dL (ref 8.7–10.2)
CO2: 21 mmol/L (ref 18–29)
Chloride: 100 mmol/L (ref 96–106)
Creatinine, Ser: 0.67 mg/dL (ref 0.57–1.00)
GFR, EST AFRICAN AMERICAN: 121 mL/min/{1.73_m2} (ref >59)
GFR, EST NON AFRICAN AMERICAN: 105 mL/min/{1.73_m2} (ref >59)
Globulin, Total: 2.4 g/dL (ref 1.5–4.5)
Glucose: 95 mg/dL (ref 65–99)
POTASSIUM: 4.6 mmol/L (ref 3.5–5.2)
SODIUM: 138 mmol/L (ref 134–144)
Total Protein: 6.7 g/dL (ref 6.0–8.5)

## 2015-06-13 LAB — VITAMIN D 25 HYDROXY (VIT D DEFICIENCY, FRACTURES): VIT D 25 HYDROXY: 22.7 ng/mL — AB (ref 30.0–100.0)

## 2015-06-13 LAB — HEMOGLOBIN A1C
ESTIMATED AVERAGE GLUCOSE: 103 mg/dL
Hgb A1c MFr Bld: 5.2 % (ref 4.8–5.6)

## 2015-06-13 LAB — CYTOLOGY - PAP

## 2015-06-13 MED ORDER — CHOLECALCIFEROL 125 MCG (5000 UT) PO CAPS: 5000.0000 [IU] | ORAL_CAPSULE | Freq: Every day | ORAL | Status: AC

## 2015-06-13 NOTE — Telephone Encounter (Signed)
Left message labs were good but needs to increase vitamin D 3 to 5000 IU every day

## 2015-06-24 ENCOUNTER — Other Ambulatory Visit (INDEPENDENT_AMBULATORY_CARE_PROVIDER_SITE_OTHER): Payer: 59

## 2015-06-24 DIAGNOSIS — Z1211 Encounter for screening for malignant neoplasm of colon: Secondary | ICD-10-CM

## 2015-06-24 LAB — HEMOCCULT GUIAC POC 1CARD (OFFICE)
FECAL OCCULT BLD: NEGATIVE
FECAL OCCULT BLD: NEGATIVE
FECAL OCCULT BLD: NEGATIVE

## 2015-06-24 NOTE — Progress Notes (Signed)
Left message letting pt know hemocult cards were all normal. JSY

## 2015-08-12 ENCOUNTER — Other Ambulatory Visit: Payer: Self-pay | Admitting: Obstetrics and Gynecology

## 2015-08-12 DIAGNOSIS — Z1231 Encounter for screening mammogram for malignant neoplasm of breast: Secondary | ICD-10-CM

## 2015-08-20 ENCOUNTER — Ambulatory Visit
Admission: RE | Admit: 2015-08-20 | Discharge: 2015-08-20 | Disposition: A | Discharge status: Home / Self Care | Payer: 59 | Source: Ambulatory Visit | Attending: Obstetrics and Gynecology | Admitting: Obstetrics and Gynecology

## 2015-08-20 DIAGNOSIS — Z1231 Encounter for screening mammogram for malignant neoplasm of breast: Secondary | ICD-10-CM

## 2018-02-18 ENCOUNTER — Other Ambulatory Visit: Payer: Self-pay | Admitting: Adult Health

## 2018-02-18 DIAGNOSIS — Z1231 Encounter for screening mammogram for malignant neoplasm of breast: Secondary | ICD-10-CM

## 2018-03-23 ENCOUNTER — Ambulatory Visit: Payer: 59

## 2018-05-11 ENCOUNTER — Ambulatory Visit: Payer: Self-pay

## 2018-06-06 DIAGNOSIS — R7301 Impaired fasting glucose: Secondary | ICD-10-CM | POA: Diagnosis not present

## 2018-06-06 DIAGNOSIS — Z0000 Encounter for general adult medical examination without abnormal findings: Secondary | ICD-10-CM | POA: Diagnosis not present

## 2018-06-09 DIAGNOSIS — Z0001 Encounter for general adult medical examination with abnormal findings: Secondary | ICD-10-CM | POA: Diagnosis not present

## 2018-06-09 DIAGNOSIS — E6609 Other obesity due to excess calories: Secondary | ICD-10-CM | POA: Diagnosis not present

## 2018-06-09 DIAGNOSIS — R7301 Impaired fasting glucose: Secondary | ICD-10-CM | POA: Diagnosis not present

## 2018-06-09 DIAGNOSIS — E782 Mixed hyperlipidemia: Secondary | ICD-10-CM | POA: Diagnosis not present

## 2018-06-10 ENCOUNTER — Encounter: Payer: Self-pay | Admitting: *Deleted

## 2018-06-27 ENCOUNTER — Ambulatory Visit: Payer: Self-pay

## 2018-08-02 ENCOUNTER — Other Ambulatory Visit: Payer: Self-pay | Admitting: Internal Medicine

## 2018-08-02 ENCOUNTER — Ambulatory Visit
Admission: RE | Admit: 2018-08-02 | Discharge: 2018-08-02 | Disposition: A | Discharge status: Home / Self Care | Payer: BC Managed Care – PPO | Source: Ambulatory Visit | Attending: Adult Health | Admitting: Adult Health

## 2018-08-02 ENCOUNTER — Encounter (INDEPENDENT_AMBULATORY_CARE_PROVIDER_SITE_OTHER): Payer: Self-pay

## 2018-08-02 ENCOUNTER — Other Ambulatory Visit: Payer: Self-pay

## 2018-08-02 DIAGNOSIS — Z1231 Encounter for screening mammogram for malignant neoplasm of breast: Secondary | ICD-10-CM | POA: Diagnosis not present

## 2018-08-10 ENCOUNTER — Telehealth: Payer: Self-pay

## 2018-08-10 NOTE — Telephone Encounter (Signed)
Pt called and needs to r/s her nurse visit apt. Pt is scheduled for 08/23/2018. [phone]

## 2018-08-10 NOTE — Telephone Encounter (Addendum)
Called pt back.  Pt changed her mind and decided to keep the nurse visit.

## 2018-08-24 ENCOUNTER — Other Ambulatory Visit: Payer: Self-pay

## 2018-08-24 ENCOUNTER — Ambulatory Visit (INDEPENDENT_AMBULATORY_CARE_PROVIDER_SITE_OTHER): Payer: BC Managed Care – PPO | Admitting: *Deleted

## 2018-08-24 DIAGNOSIS — Z1211 Encounter for screening for malignant neoplasm of colon: Secondary | ICD-10-CM

## 2018-08-24 MED ORDER — NA SULFATE-K SULFATE-MG SULF 17.5-3.13-1.6 GM/177ML PO SOLN: 1.0000 | Freq: Once | ORAL | 0 refills | Status: AC

## 2018-08-24 NOTE — Patient Instructions (Addendum)
Mckenzie Nguyen  July 22, 1965 MRN: [id number]     Procedure Date: 03/03/2019 Time to register: 7:30 am Place to register: [facility] Procedure Time: 8:30 am Scheduled provider: Dr. Oneida Alar    PREPARATION FOR COLONOSCOPY WITH SUPREP BOWEL PREP KIT  Note: Suprep Bowel Prep Kit is a split-dose (2day) regimen. Consumption of BOTH 6-ounce bottles is required for a complete prep.  Please notify us immediately if you are diabetic, take iron supplements, or if you are on Coumadin or any other blood thinners.                                                                                                                                                   1 DAY BEFORE PROCEDURE:  DATE: 03/02/2019  DAY: Thursday Continue clear liquids the entire day - NO SOLID FOOD.    At 6:00pm: Complete steps 1 through 4 below, using ONE (1) 6-ounce bottle, before going to bed. Step 1:  Pour ONE (1) 6-ounce bottle of SUPREP liquid into the mixing container.  Step 2:  Add cool drinking water to the 16 ounce line on the container and mix.  Note: Dilute the solution concentrate as directed prior to use. Step 3:  DRINK ALL the liquid in the container. Step 4:  You MUST drink an additional two (2) or more 16 ounce containers of water over the next one (1) hour.   Continue clear liquids.  DAY OF PROCEDURE:   DATE: 03/03/2019   DAY: Friday If you take medications for your heart, blood pressure, or breathing, you may take these medications.    5 hours before your procedure at :  3:30 am Step 1:  Pour ONE (1) 6-ounce bottle of SUPREP liquid into the mixing container.  Step 2:  Add cool drinking water to the 16 ounce line on the container and mix.  Note: Dilute the solution concentrate as directed prior to use. Step 3:  DRINK ALL the liquid in the container. Step 4:  You MUST drink an additional two (2) or more 16 ounce containers of water over the next one (1) hour. You MUST complete the final glass of  water at least 3 hours before your colonoscopy. Nothing by mouth past 5:30 am  You may take your morning medications with sip of water unless we have instructed otherwise.    Please see below for Dietary Information.  CLEAR LIQUIDS INCLUDE:  Water Jello (NOT red in color)   Ice Popsicles (NOT red in color)   Tea (sugar ok, no milk/cream) Powdered fruit flavored drinks  Coffee (sugar ok, no milk/cream) Gatorade/ Lemonade/ Kool-Aid  (NOT red in color)   Juice: apple, white grape, white cranberry Soft drinks  Clear bullion, consomme, broth (fat free beef/chicken/vegetable)  Carbonated beverages (any kind)  Strained chicken noodle soup Hard Candy   Remember:  Clear liquids are liquids that will allow you to see your fingers on the other side of a clear glass. Be sure liquids are NOT red in color, and not cloudy, but CLEAR.  DO NOT EAT OR DRINK ANY OF THE FOLLOWING:  Dairy products of any kind   Cranberry juice Tomato juice / V8 juice   Grapefruit juice Orange juice     Red grape juice  Do not eat any solid foods, including such foods as: cereal, oatmeal, yogurt, fruits, vegetables, creamed soups, eggs, bread, crackers, pureed foods in a blender, etc.   HELPFUL HINTS FOR DRINKING PREP SOLUTION:   Make sure prep is extremely cold. Mix and refrigerate the the morning of the prep. You may also put in the freezer.   You may try mixing some Crystal Light or Country Time Lemonade if you prefer. Mix in small amounts; add more if necessary.  Try drinking through a straw  Rinse mouth with water or a mouthwash between glasses, to remove after-taste.  Try sipping on a cold beverage /ice/ popsicles between glasses of prep.  Place a piece of sugar-free hard candy in mouth between glasses.  If you become nauseated, try consuming smaller amounts, or stretch out the time between glasses. Stop for 30-60 minutes, then slowly start back drinking.     OTHER INSTRUCTIONS  You will need a  responsible adult at least 51 years of age to accompany you and drive you home. This person must remain in the waiting room during your procedure. The hospital will cancel your procedure if you do not have a responsible adult with you.   1. Wear loose fitting clothing that is easily removed. 2. Leave jewelry and other valuables at home.  3. Remove all body piercing jewelry and leave at home. 4. Total time from sign-in until discharge is approximately 2-3 hours. 5. You should go home directly after your procedure and rest. You can resume normal activities the day after your procedure. 6. The day of your procedure you should not:  Drive  Make legal decisions  Operate machinery  Drink alcohol  Return to work   You may call the office (Dept: [phone]) before 5:00pm, or page the doctor on call [phone]) after 5:00pm, for further instructions, if necessary.   Insurance Information YOU WILL NEED TO CHECK WITH YOUR INSURANCE COMPANY FOR THE BENEFITS OF COVERAGE YOU HAVE FOR THIS PROCEDURE.  UNFORTUNATELY, NOT ALL INSURANCE COMPANIES HAVE BENEFITS TO COVER ALL OR PART OF THESE TYPES OF PROCEDURES.  IT IS YOUR RESPONSIBILITY TO CHECK YOUR BENEFITS, HOWEVER, WE WILL BE GLAD TO ASSIST YOU WITH ANY CODES YOUR INSURANCE COMPANY MAY NEED.    PLEASE NOTE THAT MOST INSURANCE COMPANIES WILL NOT COVER A SCREENING COLONOSCOPY FOR PEOPLE UNDER THE AGE OF 50  IF YOU HAVE BCBS INSURANCE, YOU MAY HAVE BENEFITS FOR A SCREENING COLONOSCOPY BUT IF POLYPS ARE FOUND THE DIAGNOSIS WILL CHANGE AND THEN YOU MAY HAVE A DEDUCTIBLE THAT WILL NEED TO BE MET. SO PLEASE MAKE SURE YOU CHECK YOUR BENEFITS FOR A SCREENING COLONOSCOPY AS WELL AS A DIAGNOSTIC COLONOSCOPY.

## 2018-08-24 NOTE — Progress Notes (Signed)
Gastroenterology Pre-Procedure Review  Request Date: 08/24/2018 Requesting Physician: Dr. Wende Neighbors, No previous TCS  PATIENT REVIEW QUESTIONS: The patient responded to the following health history questions as indicated:    1. Diabetes Melitis: No 2. Joint replacements in the past 12 months: No 3. Major health problems in the past 3 months: No 4. Has an artificial valve or MVP: No 5. Has a defibrillator: No 6. Has been advised in past to take antibiotics in advance of a procedure like teeth cleaning: No 7. Family history of colon cancer: No  8. Alcohol Use: Yes, 1 glass of wine daily 9. History of sleep apnea: No  10. History of coronary artery or other vascular stents placed within the last 12 months: No 11. History of any prior anesthesia complications: No    MEDICATIONS & ALLERGIES:    Patient reports the following regarding taking any blood thinners:   Plavix? No Aspirin? No Coumadin? No Brilinta? No Xarelto? No Eliquis? No Pradaxa? No Savaysa? No Effient? No  Patient confirms/reports the following medications:  Current Outpatient Medications  Medication Sig Dispense Refill  . Cholecalciferol 5000 units capsule Take 1 capsule (5,000 Units total) by mouth daily.    . Echinacea-Vitamin C (VITAMIN C-ECHINACEA PO) Take by mouth daily.    Kendall Flack 575 MG/5ML SYRP Take by mouth. Takes 1 tsp daily    . Magnesium-Potassium 250-100 MG TABS Take 1 capsule by mouth daily.    . Multiple Vitamins-Minerals (MULTIVITAMIN WOMEN PO) Take by mouth daily.     No current facility-administered medications for this visit.     Patient confirms/reports the following allergies:  No Known Allergies  No orders of the defined types were placed in this encounter.   AUTHORIZATION INFORMATION Primary Insurance: [place] [employer] [place],  [place] #: [id number],  Group #: [id number] Pre-Cert / Josem Kaufmann required: No, file to local BCBS  SCHEDULE INFORMATION: Procedure has been scheduled as follows:   Date: 11/04/2018, Time: 8:30 Location: [facility] with Dr. Oneida Alar  This Gastroenterology Pre-Precedure Review Form is being routed to the following provider(s): Aliene Altes, PA-C

## 2018-08-24 NOTE — Progress Notes (Signed)
Appropriate.

## 2018-08-25 NOTE — Addendum Note (Signed)
Addended by: Metro Kung on: 08/25/2018 07:35 AM   Modules accepted: Orders, SmartSet

## 2018-10-26 ENCOUNTER — Telehealth: Payer: Self-pay | Admitting: Gastroenterology

## 2018-10-26 NOTE — Telephone Encounter (Signed)
Pt is scheduled with SF on 10/30 and wants to reschedule her procedure. [phone]

## 2018-10-26 NOTE — Telephone Encounter (Signed)
Lmom for pt to call me back. 

## 2018-10-27 NOTE — Telephone Encounter (Signed)
Lmom for pt to call me back. 

## 2018-10-28 NOTE — Telephone Encounter (Signed)
Spoke with pt and she rescheduled her procedure to 03/03/2019.  Endo notified.

## 2018-11-01 ENCOUNTER — Encounter (HOSPITAL_COMMUNITY): Payer: BC Managed Care – PPO

## 2018-12-15 DIAGNOSIS — R7301 Impaired fasting glucose: Secondary | ICD-10-CM | POA: Diagnosis not present

## 2018-12-15 DIAGNOSIS — E782 Mixed hyperlipidemia: Secondary | ICD-10-CM | POA: Diagnosis not present

## 2018-12-22 DIAGNOSIS — R7301 Impaired fasting glucose: Secondary | ICD-10-CM | POA: Diagnosis not present

## 2018-12-22 DIAGNOSIS — E782 Mixed hyperlipidemia: Secondary | ICD-10-CM | POA: Diagnosis not present

## 2018-12-22 DIAGNOSIS — I10 Essential (primary) hypertension: Secondary | ICD-10-CM | POA: Diagnosis not present

## 2018-12-22 DIAGNOSIS — E6609 Other obesity due to excess calories: Secondary | ICD-10-CM | POA: Diagnosis not present

## 2019-01-18 DIAGNOSIS — Z6839 Body mass index (BMI) 39.0-39.9, adult: Secondary | ICD-10-CM | POA: Diagnosis not present

## 2019-01-18 DIAGNOSIS — E6609 Other obesity due to excess calories: Secondary | ICD-10-CM | POA: Diagnosis not present

## 2019-01-18 DIAGNOSIS — I10 Essential (primary) hypertension: Secondary | ICD-10-CM | POA: Diagnosis not present

## 2019-01-18 DIAGNOSIS — F419 Anxiety disorder, unspecified: Secondary | ICD-10-CM | POA: Diagnosis not present

## 2019-02-09 ENCOUNTER — Telehealth: Payer: Self-pay | Admitting: Gastroenterology

## 2019-02-09 NOTE — Telephone Encounter (Signed)
Patient called and wants to cancel her upcoming procedure

## 2019-02-09 NOTE — Telephone Encounter (Signed)
Routing to AS 

## 2019-02-09 NOTE — Telephone Encounter (Signed)
Tried to call pt back to see if she wanted to reschedule but had to leave a vm for pt to call us back.  Kim in Endo notified.  Cancelled off schedule.  Pt will need new orders put in if she calls back.

## 2019-03-01 ENCOUNTER — Other Ambulatory Visit (HOSPITAL_COMMUNITY): Payer: BC Managed Care – PPO

## 2019-03-03 ENCOUNTER — Encounter (HOSPITAL_COMMUNITY): Payer: Self-pay

## 2019-03-03 ENCOUNTER — Ambulatory Visit (HOSPITAL_COMMUNITY): Admit: 2019-03-03 | Payer: BC Managed Care – PPO | Admitting: Gastroenterology

## 2019-03-03 SURGERY — COLONOSCOPY
Anesthesia: Moderate Sedation

## 2021-03-27 ENCOUNTER — Telehealth: Payer: Self-pay | Admitting: Internal Medicine

## 2021-03-27 ENCOUNTER — Other Ambulatory Visit: Payer: Self-pay | Admitting: Internal Medicine

## 2021-03-27 DIAGNOSIS — Z1231 Encounter for screening mammogram for malignant neoplasm of breast: Secondary | ICD-10-CM

## 2021-03-27 NOTE — Telephone Encounter (Signed)
Spoke to pt.  Scheduled nurse visit by phone for 04/21/2021 at 9:00. ?

## 2021-03-27 NOTE — Telephone Encounter (Signed)
Patient wants to schedule her tcs now  ?

## 2021-04-01 ENCOUNTER — Ambulatory Visit
Admission: RE | Admit: 2021-04-01 | Discharge: 2021-04-01 | Disposition: A | Discharge status: Home / Self Care | Payer: Federal, State, Local not specified - PPO | Source: Ambulatory Visit | Attending: Internal Medicine | Admitting: Internal Medicine

## 2021-04-01 DIAGNOSIS — Z1231 Encounter for screening mammogram for malignant neoplasm of breast: Secondary | ICD-10-CM

## 2021-04-21 ENCOUNTER — Ambulatory Visit (INDEPENDENT_AMBULATORY_CARE_PROVIDER_SITE_OTHER): Payer: Self-pay | Admitting: *Deleted

## 2021-04-21 VITALS — Ht 65.0 in | Wt 222.0 lb

## 2021-04-21 DIAGNOSIS — Z1211 Encounter for screening for malignant neoplasm of colon: Secondary | ICD-10-CM

## 2021-04-21 NOTE — Progress Notes (Addendum)
Gastroenterology Pre-Procedure Review ? ?Request Date: 04/21/2021 ?Requesting Physician: Last triage done 08/24/2018, no previous TCS ? ?PATIENT REVIEW QUESTIONS: The patient responded to the following health history questions as indicated:   ? ?1. Diabetes Melitis: no ?2. Joint replacements in the past 12 months: no ?3. Major health problems in the past 3 months: no ?4. Has an artificial valve or MVP: no ?5. Has a defibrillator: no ?6. Has been advised in past to take antibiotics in advance of a procedure like teeth cleaning: no ?7. Family history of colon cancer: no  ?8. Alcohol Use: no ?9. Illicit drug Use: no ?10. History of sleep apnea: no  ?11. History of coronary artery or other vascular stents placed within the last 12 months: no ?12. History of any prior anesthesia complications: no ?13. Body mass index is 36.94 kg/m?. ?   ?MEDICATIONS & ALLERGIES:    ?Patient reports the following regarding taking any blood thinners:   ?Plavix? no ?Aspirin? no ?Coumadin? no ?Brilinta? no ?Xarelto? no ?Eliquis? no ?Pradaxa? no ?Savaysa? no ?Effient? no ? ?Patient confirms/reports the following medications:  ?Current Outpatient Medications  ?Medication Sig Dispense Refill  ? Cholecalciferol 5000 units capsule Take 1 capsule (5,000 Units total) by mouth daily.    ? Echinacea-Vitamin C (VITAMIN C-ECHINACEA PO) Take by mouth daily.    ? Elderberry 575 MG/5ML SYRP Take by mouth as needed. Takes 1 tsp as needed.    ? Magnesium-Potassium 250-100 MG TABS Take 1 capsule by mouth as needed.    ? Multiple Vitamins-Minerals (MULTIVITAMIN WOMEN PO) Take by mouth daily.    ? UNABLE TO FIND as needed. Med Name: CBD Oil    ? ?No current facility-administered medications for this visit.  ? ? ?Patient confirms/reports the following allergies:  ?No Known Allergies ? ?No orders of the defined types were placed in this encounter. ? ? ?AUTHORIZATION INFORMATION ?Primary Insurance: [employer] Employee,  [place] #: [id number],  Group #: [id number] ?Pre-Cert /  [place] required: No, not required ? ?SCHEDULE INFORMATION: ?Procedure has been scheduled as follows:  ?Date: 06/17/2021, Time:  10:00 ?Location: [facility] with Dr. Marletta Lor ? ?This Gastroenterology Pre-Precedure Review Form is being routed to the following provider(s): Tana Coast, PA-C ?  ?

## 2021-04-22 NOTE — Progress Notes (Signed)
Ok to schedule. ASA 2.  

## 2021-04-22 NOTE — Progress Notes (Signed)
Lmom for pt to call me back. 

## 2021-04-24 ENCOUNTER — Telehealth: Payer: Self-pay | Admitting: *Deleted

## 2021-04-24 ENCOUNTER — Encounter: Payer: Self-pay | Admitting: *Deleted

## 2021-04-24 NOTE — Progress Notes (Signed)
Mailed letter to pt

## 2021-04-24 NOTE — Progress Notes (Signed)
Lmom for pt to call us back. 

## 2021-04-24 NOTE — Telephone Encounter (Signed)
Tried to call pt to schedule procedure.  Multiple messages left on voice mail.  Mailed letter to pt. ?

## 2021-05-01 ENCOUNTER — Other Ambulatory Visit: Payer: BC Managed Care – PPO | Admitting: Adult Health

## 2021-05-13 ENCOUNTER — Encounter: Payer: Self-pay | Admitting: *Deleted

## 2021-05-13 ENCOUNTER — Telehealth: Payer: Self-pay | Admitting: Internal Medicine

## 2021-05-13 ENCOUNTER — Other Ambulatory Visit: Payer: Self-pay | Admitting: *Deleted

## 2021-05-13 DIAGNOSIS — Z1211 Encounter for screening for malignant neoplasm of colon: Secondary | ICD-10-CM

## 2021-05-13 MED ORDER — NA SULFATE-K SULFATE-MG SULF 17.5-3.13-1.6 GM/177ML PO SOLN: 1.0000 | Freq: Once | ORAL | 0 refills | Status: AC

## 2021-05-13 NOTE — Addendum Note (Signed)
Addended by: Noreene Larsson on: 05/13/2021 02:29 PM ? ? Modules accepted: Orders ? ?

## 2021-05-13 NOTE — Telephone Encounter (Signed)
Patient called and left message that she needed to schedule her tcs   ?

## 2021-05-13 NOTE — Telephone Encounter (Signed)
Spoke to pt.  She scheduled procedure for 06/17/2021 at 10:00, arrival 8:30 at [facility].  Reviewed prep instructions with pt by phone.  She was informed to pick up prep kit at her pharmacy.  She was made aware to have pregnancy test done 06/13/2021 at [facility].  Confirmed mailing address and mailed instructions. ?

## 2021-05-23 ENCOUNTER — Telehealth: Payer: Self-pay

## 2021-05-23 NOTE — Telephone Encounter (Signed)
I left voice for pt that provider will be out of the office due to surgery, I went ahead and rescheduled appointment, and printed letter to be mail to patient.

## 2021-05-28 ENCOUNTER — Other Ambulatory Visit: Payer: Federal, State, Local not specified - PPO | Admitting: Adult Health

## 2021-06-10 ENCOUNTER — Telehealth: Payer: Self-pay | Admitting: Internal Medicine

## 2021-06-10 NOTE — Telephone Encounter (Signed)
Spoke with pt. She wants to cancel for now. Wants to wait until spet/oct. Aware will call once we receive that schedule

## 2021-06-10 NOTE — Telephone Encounter (Signed)
Patient needs to reschedule her procedure 

## 2021-06-17 ENCOUNTER — Encounter (HOSPITAL_COMMUNITY): Payer: Self-pay

## 2021-06-17 ENCOUNTER — Ambulatory Visit (HOSPITAL_COMMUNITY): Admit: 2021-06-17 | Payer: Federal, State, Local not specified - PPO

## 2021-06-17 SURGERY — COLONOSCOPY WITH PROPOFOL
Anesthesia: Monitor Anesthesia Care

## 2021-07-14 ENCOUNTER — Other Ambulatory Visit: Payer: Federal, State, Local not specified - PPO | Admitting: Adult Health

## 2021-08-12 ENCOUNTER — Telehealth: Payer: Self-pay | Admitting: *Deleted

## 2021-08-12 NOTE — Telephone Encounter (Signed)
LMTRC to schedule TCS ASA 2 with Dr. Carver. 

## 2021-08-20 NOTE — Telephone Encounter (Signed)
LMTRC to schedule colonoscopy 

## 2021-08-21 NOTE — Telephone Encounter (Signed)
Mailed letter to pt

## 2021-12-17 ENCOUNTER — Emergency Department (HOSPITAL_COMMUNITY)
Admission: EM | Admit: 2021-12-17 | Discharge: 2021-12-17 | Disposition: A | Discharge status: Home / Self Care | Payer: 59 | Attending: Emergency Medicine | Admitting: Emergency Medicine

## 2021-12-17 ENCOUNTER — Other Ambulatory Visit: Payer: Self-pay

## 2021-12-17 DIAGNOSIS — Z1152 Encounter for screening for COVID-19: Secondary | ICD-10-CM | POA: Diagnosis not present

## 2021-12-17 DIAGNOSIS — R051 Acute cough: Secondary | ICD-10-CM

## 2021-12-17 DIAGNOSIS — J101 Influenza due to other identified influenza virus with other respiratory manifestations: Secondary | ICD-10-CM | POA: Diagnosis not present

## 2021-12-17 DIAGNOSIS — Z87891 Personal history of nicotine dependence: Secondary | ICD-10-CM | POA: Insufficient documentation

## 2021-12-17 DIAGNOSIS — R42 Dizziness and giddiness: Secondary | ICD-10-CM | POA: Insufficient documentation

## 2021-12-17 DIAGNOSIS — R509 Fever, unspecified: Secondary | ICD-10-CM

## 2021-12-17 LAB — RESP PANEL BY RT-PCR (RSV, FLU A&B, COVID)  RVPGX2
Influenza A by PCR: POSITIVE — AB
Influenza B by PCR: NEGATIVE
Resp Syncytial Virus by PCR: NEGATIVE
SARS Coronavirus 2 by RT PCR: NEGATIVE

## 2021-12-17 NOTE — ED Triage Notes (Signed)
Pt c/o dizziness, weakness, and feeling "feverish". States she felt sick a few days ago but started feeling worse tonight. Pt afebrile in triage.

## 2021-12-17 NOTE — Discharge Instructions (Signed)
You were evaluated in the Emergency Department and after careful evaluation, we did not find any emergent condition requiring admission or further testing in the hospital.  Your exam/testing today is overall reassuring.  You have tested positive for influenza A, which explains your symptoms.  Recommend rest, fluids, Tylenol or Motrin for discomfort.  Please return to the Emergency Department if you experience any worsening of your condition.   Thank you for allowing [place] to be a part of your care.

## 2021-12-17 NOTE — ED Notes (Signed)
Pt ambulated around nursing station Steady gait Pt stated she felt fine

## 2021-12-17 NOTE — ED Provider Notes (Signed)
[facility] Emergency Department Provider Note MRN:  [id number]  Arrival date & time: 12/17/21     Chief Complaint   Dizziness   History of Present Illness   Mckenzie Nguyen is a 56 y.o. year-old female with no pertinent past medical history presenting to the ED with chief complaint of dizziness.  Dizziness tonight, has been feeling feverish, recent cough and cold-like symptoms, multiple coworkers have been diagnosed with the flu.  Review of Systems  A thorough review of systems was obtained and all systems are negative except as noted in the HPI and PMH.   Patient's Health History    Past Medical History:  Diagnosis Date   Fecal occult blood test positive 06/12/2015   Had beet juice yesterday, will send 3 hemoccult cards home with her for 3 BMs on 3 different days   Migraines    Obesity    Peri-menopause 05/03/2014   Superficial fungus infection of skin 05/03/2014   Vitamin D deficiency 06/13/2015    Past Surgical History:  Procedure Laterality Date   DILATION AND CURETTAGE OF UTERUS  1991   FOOT SURGERY Left 1991   SHOULDER ARTHROSCOPY W/ ROTATOR CUFF REPAIR Left 2010    Family History  Problem Relation Age of Onset   Hypertension Mother    Asthma Mother    Cancer Father        lung   Diabetes Father    Hypertension Father     Social History   Socioeconomic History   Marital status: Divorced    Spouse name: Not on file   Number of children: Not on file   Years of education: Not on file   Highest education level: Not on file  Occupational History   Not on file  Tobacco Use   Smoking status: Former    Years: 15.00    Types: Cigarettes    Quit date: 02/07/2003    Years since quitting: 18.8   Smokeless tobacco: Never  Substance and Sexual Activity   Alcohol use: Yes    Alcohol/week: 7.0 standard drinks of alcohol    Types: 7 Glasses of wine per week    Comment: 1 glass of wine daily   Drug use: No   Sexual activity: Yes    Birth  control/protection: None, Condom  Other Topics Concern   Not on file  Social History Narrative   Not on file   Social Determinants of Health   Financial Resource Strain: Not on file  Food Insecurity: Not on file  Transportation Needs: Not on file  Physical Activity: Not on file  Stress: Not on file  Social Connections: Not on file  Intimate Partner Violence: Not on file     Physical Exam   Vitals:   12/17/21 0500 12/17/21 0530  BP: 135/67 136/70  Pulse: (!) 58 60  Resp: 16 14  Temp:    SpO2: 96% 97%    CONSTITUTIONAL: Well-appearing, NAD NEURO/PSYCH:  Alert and oriented x 3, no focal deficits EYES:  eyes equal and reactive ENT/NECK:  no LAD, no JVD CARDIO: Regular rate, well-perfused, normal S1 and S2 PULM:  CTAB no wheezing or rhonchi GI/GU:  non-distended, non-tender MSK/SPINE:  No gross deformities, no edema SKIN:  no rash, atraumatic   *Additional and/or pertinent findings included in MDM below  Diagnostic and Interventional Summary    EKG Interpretation  Date/Time:  Wednesday December 17 2021 04:16:44 EST Ventricular Rate:  61 PR Interval:  178 QRS Duration: 94 QT  Interval:  534 QTC Calculation: 538 R Axis:   59 Text Interpretation: Sinus rhythm Abnormal T, consider ischemia, lateral leads Prolonged QT interval Confirmed by Kennis Carina [phone]) on 12/17/2021 4:35:51 AM       Labs Reviewed  RESP PANEL BY RT-PCR (RSV, FLU A&B, COVID)  RVPGX2 - Abnormal; Notable for the following components:      Result Value   Influenza A by PCR POSITIVE (*)    All other components within normal limits    No orders to display    Medications - No data to display   Procedures  /  Critical Care Procedures  ED Course and Medical Decision Making  Initial Impression and Ddx Flulike symptoms, suspicious for influenza, having some dizziness described as a room spinning sensation intermittently tonight.  Has no nystagmus, no neurological deficits, currently is not  dizzy.  Doubt CNS emergency, patient not having any chest pain or shortness of breath, currently without symptoms, normal vital signs, swabbing for influenza, obtaining screening EKG and will reassess.  Past medical/surgical history that increases complexity of ED encounter: None  Interpretation of Diagnostics I personally reviewed the EKG and my interpretation is as follows: Sinus rhythm, nonspecific T wave changes, T wave inversions noted but without chest pain in the setting of viral symptoms not of significant concern  Viral panel positive for influenza A  Patient Reassessment and Ultimate Disposition/Management     Patient continues to feel well without symptoms, normal vital signs, ambulates without issue, appropriate for discharge.  Symptoms for 4 days, not a candidate for Tamiflu.  Patient management required discussion with the following services or consulting groups:  None  Complexity of Problems Addressed Acute complicated illness or Injury  Additional Data Reviewed and Analyzed Further history obtained from: Further history from spouse/family member  Additional Factors Impacting ED Encounter Risk None  Elmer Sow. Pilar Plate, MD [facility] Emergency Medicine [facility] [email or web address]  Final Clinical Impressions(s) / ED Diagnoses     ICD-10-CM   1. Dizziness  R42     2. Acute cough  R05.1     3. Subjective fever  R50.9     4. Influenza A  J10.1       ED Discharge Orders     None        Discharge Instructions Discussed with and Provided to Patient:     Discharge Instructions      You were evaluated in the Emergency Department and after careful evaluation, we did not find any emergent condition requiring admission or further testing in the hospital.  Your exam/testing today is overall reassuring.  You have tested positive for influenza A, which explains your symptoms.  Recommend rest, fluids, Tylenol or Motrin for  discomfort.  Please return to the Emergency Department if you experience any worsening of your condition.   Thank you for allowing [place] to be a part of your care.       Sabas Sous, MD 12/17/21 [phone]

## 2022-06-10 ENCOUNTER — Other Ambulatory Visit: Payer: Self-pay | Admitting: Internal Medicine

## 2022-06-10 DIAGNOSIS — Z1231 Encounter for screening mammogram for malignant neoplasm of breast: Secondary | ICD-10-CM

## 2022-06-15 ENCOUNTER — Ambulatory Visit
Admission: RE | Admit: 2022-06-15 | Discharge: 2022-06-15 | Disposition: A | Discharge status: Home / Self Care | Payer: Federal, State, Local not specified - PPO | Source: Ambulatory Visit | Attending: Internal Medicine | Admitting: Internal Medicine

## 2022-06-15 DIAGNOSIS — Z1231 Encounter for screening mammogram for malignant neoplasm of breast: Secondary | ICD-10-CM

## 2023-07-20 IMAGING — MG MM DIGITAL SCREENING BILAT W/ TOMO AND CAD
8 series · 8 of 24 positions shown · non-contrast
Comparison: Previous exam(s).

CLINICAL DATA: Screening.

EXAM:
DIGITAL SCREENING BILATERAL MAMMOGRAM WITH TOMOSYNTHESIS AND CAD
TECHNIQUE: Bilateral screening digital craniocaudal and mediolateral oblique
mammograms were obtained. Bilateral screening digital breast
tomosynthesis was performed. The images were evaluated with
computer-aided detection.

[L MLO synth-2D]
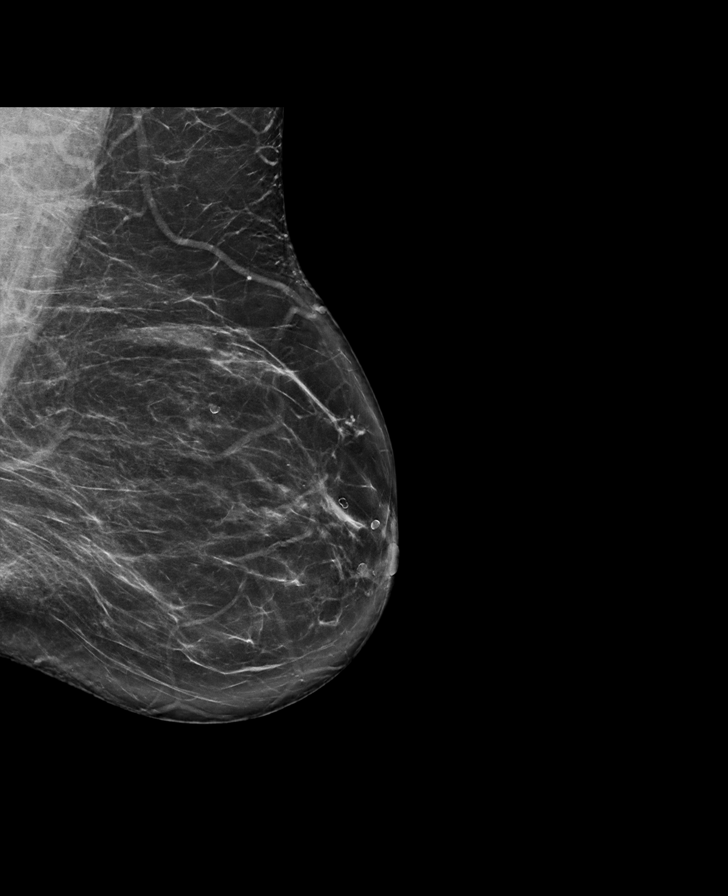

[R MLO synth-2D]
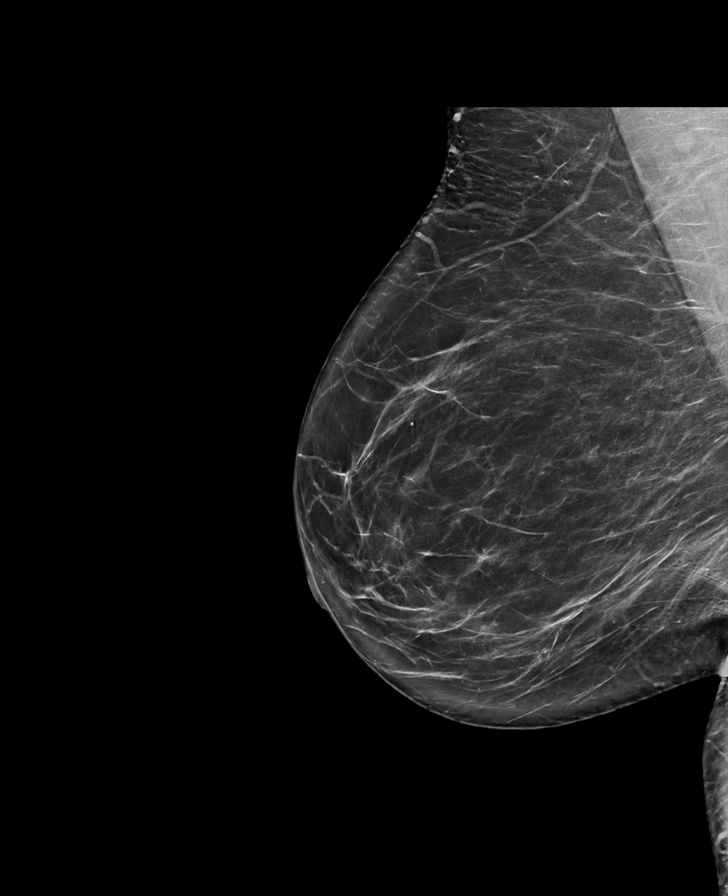

[R CC synth-2D]
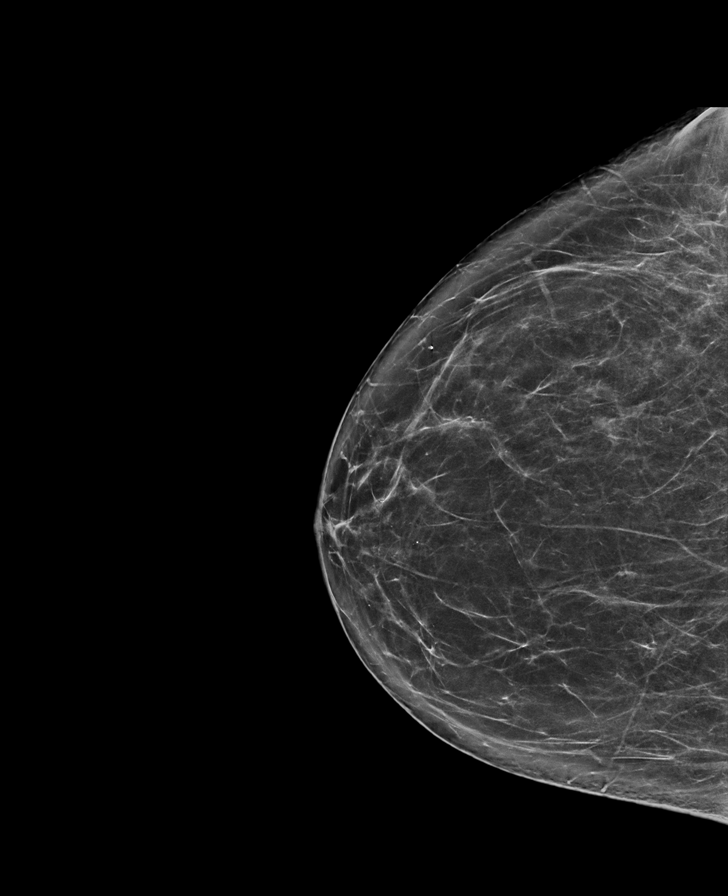

[L CC synth-2D]
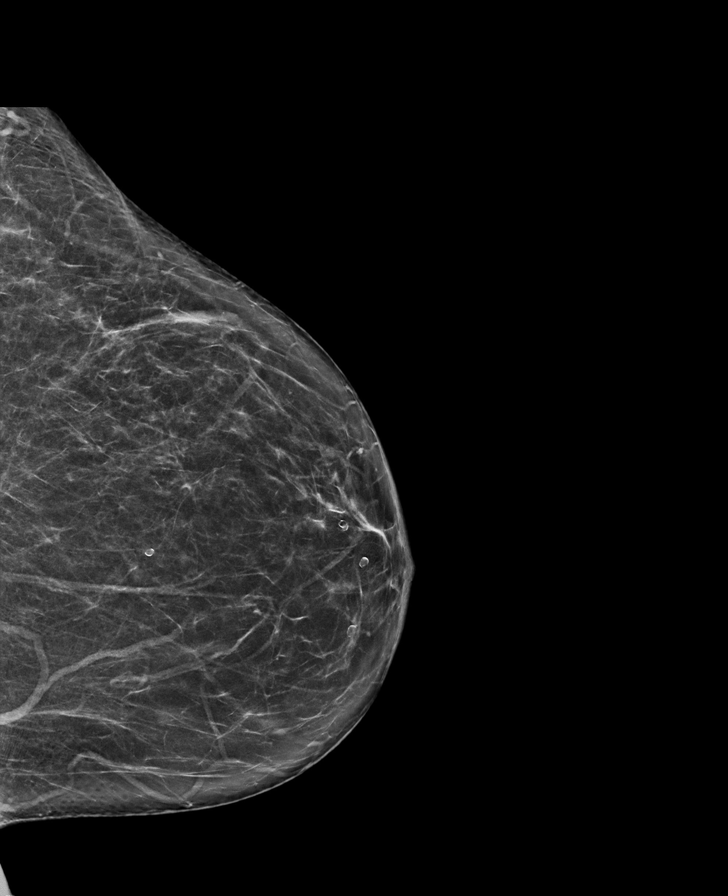

[R MLO tomo · tomo slice 43/85.0]
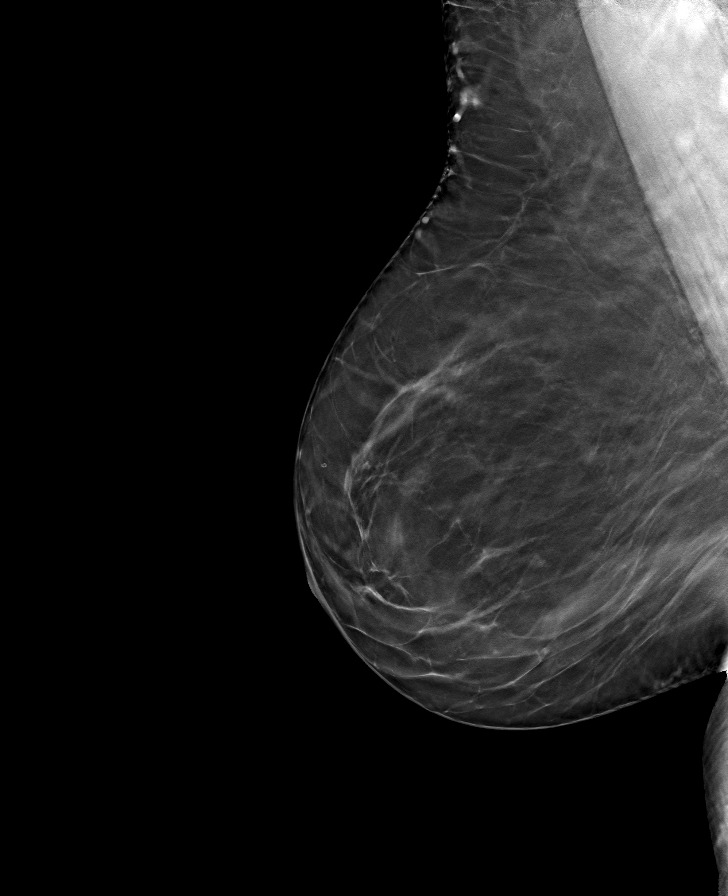

[R CC tomo · tomo slice 38/75.0]
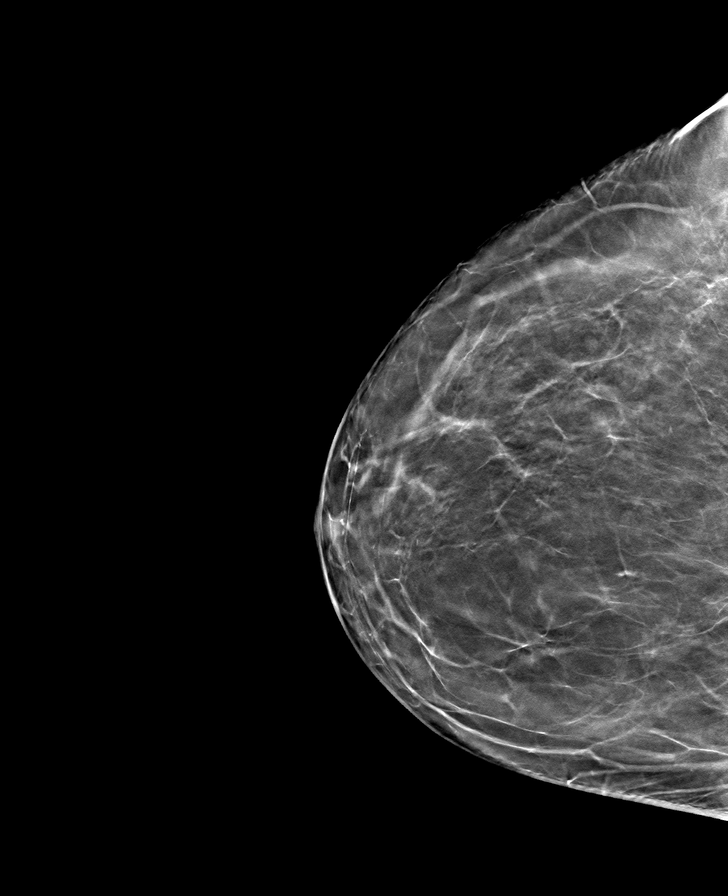

[L CC tomo · tomo slice 35/70.0]
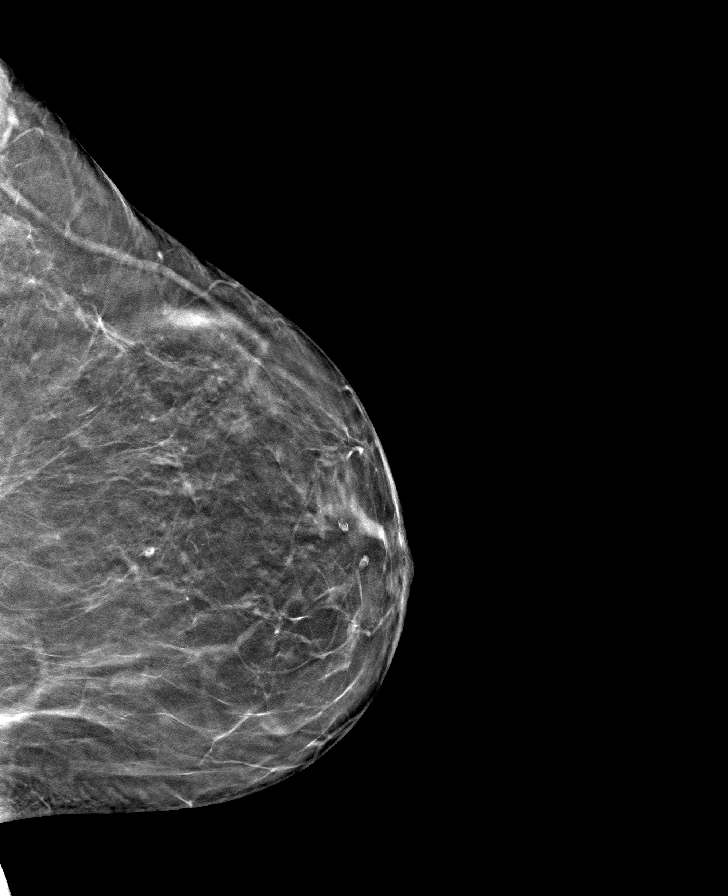

[L MLO tomo · tomo slice 43/84.0]
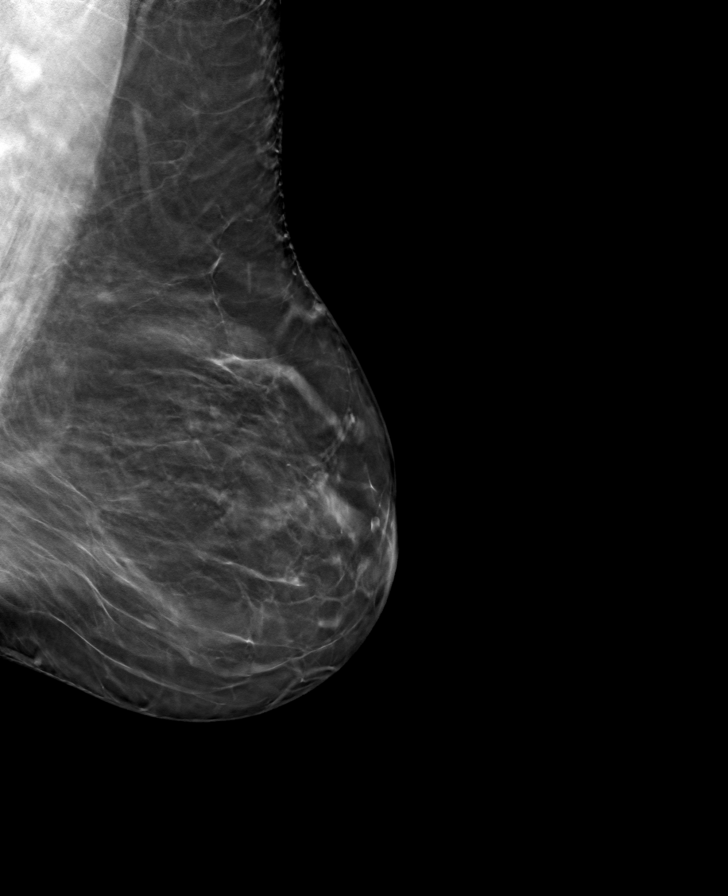

[8 of 24 positions shown; findings below may reference images not displayed]

ACR Breast Density Category c: The breast tissue is heterogeneously
dense, which may obscure small masses.
FINDINGS: There are no findings suspicious for malignancy.
IMPRESSION: No mammographic evidence of malignancy. A result letter of this
screening mammogram will be mailed directly to the patient.

RECOMMENDATION:
Screening mammogram in one year. (Code:Q3-W-BC3)

BI-RADS CATEGORY  1: Negative.
